# Patient Record
Sex: Female | Born: 1980 | Race: White | Hispanic: No | Marital: Married | Smoking: Never smoker
Health system: Southern US, Community
[De-identification: ages and names within clinical notes are randomized; demographics above are authoritative.]

## PROBLEM LIST (undated history)

## (undated) DIAGNOSIS — Z91018 Allergy to other foods: Secondary | ICD-10-CM

---

## 2017-12-05 ENCOUNTER — Emergency Department (HOSPITAL_BASED_OUTPATIENT_CLINIC_OR_DEPARTMENT_OTHER)
Admission: EM | Admit: 2017-12-05 | Discharge: 2017-12-05 | Disposition: A | Discharge status: Home / Self Care | Payer: Self-pay | Attending: Emergency Medicine | Admitting: Emergency Medicine

## 2017-12-05 ENCOUNTER — Encounter (HOSPITAL_BASED_OUTPATIENT_CLINIC_OR_DEPARTMENT_OTHER): Payer: Self-pay | Admitting: Emergency Medicine

## 2017-12-05 ENCOUNTER — Other Ambulatory Visit: Payer: Self-pay

## 2017-12-05 ENCOUNTER — Emergency Department (HOSPITAL_BASED_OUTPATIENT_CLINIC_OR_DEPARTMENT_OTHER): Payer: Self-pay

## 2017-12-05 DIAGNOSIS — J029 Acute pharyngitis, unspecified: Secondary | ICD-10-CM | POA: Insufficient documentation

## 2017-12-05 HISTORY — DX: Allergy to other foods: Z91.018

## 2017-12-05 LAB — BASIC METABOLIC PANEL
ANION GAP: 8 (ref 5–15)
BUN: 10 mg/dL (ref 6–20)
CALCIUM: 9.5 mg/dL (ref 8.9–10.3)
CO2: 26 mmol/L (ref 22–32)
Chloride: 105 mmol/L (ref 98–111)
Creatinine, Ser: 0.84 mg/dL (ref 0.44–1.00)
GFR calc Af Amer: >60 mL/min (ref >60)
GFR calc non Af Amer: >60 mL/min (ref >60)
GLUCOSE: 116 mg/dL — AB (ref 70–99)
Potassium: 3.8 mmol/L (ref 3.5–5.1)
SODIUM: 139 mmol/L (ref 135–145)

## 2017-12-05 LAB — CBC WITH DIFFERENTIAL/PLATELET
BASOS ABS: 0 10*3/uL (ref 0.0–0.1)
Basophils Relative: 0 %
Eosinophils Absolute: 0.1 10*3/uL (ref 0.0–0.7)
Eosinophils Relative: 1 %
HEMATOCRIT: 40.7 % (ref 36.0–46.0)
Hemoglobin: 13.9 g/dL (ref 12.0–15.0)
Lymphocytes Relative: 27 %
Lymphs Abs: 2.1 10*3/uL (ref 0.7–4.0)
MCH: 29.3 pg (ref 26.0–34.0)
MCHC: 34.2 g/dL (ref 30.0–36.0)
MCV: 85.7 fL (ref 78.0–100.0)
MONO ABS: 0.5 10*3/uL (ref 0.1–1.0)
Monocytes Relative: 6 %
NEUTROS ABS: 5.3 10*3/uL (ref 1.7–7.7)
Neutrophils Relative %: 66 %
PLATELETS: 192 10*3/uL (ref 150–400)
RBC: 4.75 MIL/uL (ref 3.87–5.11)
RDW: 13.2 % (ref 11.5–15.5)
WBC: 8 10*3/uL (ref 4.0–10.5)

## 2017-12-05 LAB — URINALYSIS, ROUTINE W REFLEX MICROSCOPIC
Bilirubin Urine: NEGATIVE
Glucose, UA: NEGATIVE mg/dL
Hgb urine dipstick: NEGATIVE
KETONES UR: NEGATIVE mg/dL
LEUKOCYTES UA: NEGATIVE
NITRITE: NEGATIVE
PH: 7 (ref 5.0–8.0)
PROTEIN: NEGATIVE mg/dL
Specific Gravity, Urine: <1.005 — ABNORMAL LOW (ref 1.005–1.030)

## 2017-12-05 LAB — PREGNANCY, URINE: PREG TEST UR: NEGATIVE

## 2017-12-05 LAB — MONONUCLEOSIS SCREEN: MONO SCREEN: NEGATIVE

## 2017-12-05 LAB — GROUP A STREP BY PCR: Group A Strep by PCR: NOT DETECTED

## 2017-12-05 MED ORDER — IBUPROFEN 600 MG PO TABS: 600.0000 mg | ORAL_TABLET | Freq: Four times a day (QID) | ORAL | 0 refills | Status: AC | PRN

## 2017-12-05 MED ORDER — AMOXICILLIN 500 MG PO CAPS: 500.0000 mg | ORAL_CAPSULE | Freq: Three times a day (TID) | ORAL | 0 refills | Status: AC

## 2017-12-05 MED ORDER — IOPAMIDOL (ISOVUE-300) INJECTION 61%
100.0000 mL | Freq: Once | INTRAVENOUS | Status: AC | PRN
  Administered 2017-12-05: 100 mL via INTRAVENOUS

## 2017-12-05 MED ORDER — HYDROCODONE-ACETAMINOPHEN 5-325 MG PO TABS: 1.0000 | ORAL_TABLET | ORAL | 0 refills | Status: AC | PRN

## 2017-12-05 MED ORDER — AMOXICILLIN 500 MG PO CAPS
500.0000 mg | ORAL_CAPSULE | Freq: Once | ORAL | Status: AC
  Administered 2017-12-05: 500 mg via ORAL
  Filled 2017-12-05: qty 1

## 2017-12-05 MED ORDER — LIDOCAINE VISCOUS HCL 2 % MT SOLN
15.0000 mL | Freq: Once | OROMUCOSAL | Status: AC
  Administered 2017-12-05: 15 mL via OROMUCOSAL
  Filled 2017-12-05: qty 15

## 2017-12-05 NOTE — ED Triage Notes (Addendum)
Patient states that she feels like she has a lump in her throat and swelling to the under side of her neck  - that patient states that she also feels like there are lumps all down into her throat into her chest and it is causing her throat to hurt - the patient states that she is having some trouble swelling and breathing - no noted distress while in triage and swallowing in triage with not difficulties. She also continues with it feels numb on the outside of her throat. The patient continues to have more and more complaints while being triaged - She states that this has been happening " on and off for 2 weeks" - but reports that today it is worse

## 2017-12-05 NOTE — ED Provider Notes (Signed)
[facility] EMERGENCY DEPARTMENT Provider Note   CSN: [id number] Arrival date & time: 12/05/17  1803     History   Chief Complaint Chief Complaint  Patient presents with  . Sore Throat    HPI Shirley Miranda is a 37 y.o. female.  Pt presents to the ED today with a sore throat.  She said she feels like she has lumps on the sides of her throat and in her throat.  She feels like she can't swallow.  She is very anxious and is tearful on exam.  She said sx have been going on for 2 weeks.      Past Medical History:  Diagnosis Date  . Multiple food allergies     There are no active problems to display for this patient.   History reviewed. No pertinent surgical history.   OB History   None      Home Medications    Prior to Admission medications   Medication Sig Start Date End Date Taking? Authorizing Provider  amoxicillin (AMOXIL) 500 MG capsule Take 1 capsule (500 mg total) by mouth 3 (three) times daily. 12/05/17   Jacalyn LefevreHaviland, Nayson Traweek, MD  HYDROcodone-acetaminophen (NORCO/VICODIN) 5-325 MG tablet Take 1 tablet by mouth every 4 (four) hours as needed. 12/05/17   Jacalyn LefevreHaviland, Kimya Mccahill, MD  ibuprofen (ADVIL,MOTRIN) 600 MG tablet Take 1 tablet (600 mg total) by mouth every 6 (six) hours as needed. 12/05/17   Jacalyn LefevreHaviland, Lyliana Dicenso, MD    Family History History reviewed. No pertinent family history.  Social History Social History   Tobacco Use  . Smoking status: Never Smoker  . Smokeless tobacco: Never Used  Substance Use Topics  . Alcohol use: Never    Frequency: Never  . Drug use: Never     Allergies   Prilosec [omeprazole]   Review of Systems Review of Systems  HENT: Positive for sore throat.   All other systems reviewed and are negative.    Physical Exam Updated Vital Signs BP 115/63 (BP Location: Left Arm)   Pulse 72   Temp 98.3 F (36.8 C) (Oral)   Resp 18   Ht 5\' 7"  (1.702 m)   Wt 108.9 kg   LMP 11/14/2017   SpO2 100%   BMI 37.59 kg/m    Physical Exam  Constitutional: She appears well-developed and well-nourished. She appears distressed.  HENT:  Head: Normocephalic and atraumatic.  Right Ear: Hearing, tympanic membrane and ear canal normal.  Left Ear: Hearing, tympanic membrane and ear canal normal.  Mouth/Throat: Uvula is midline and oropharynx is clear and moist.  Eyes: Pupils are equal, round, and reactive to light. EOM are normal.  Cardiovascular: Normal rate and regular rhythm.  Pulmonary/Chest: Effort normal and breath sounds normal.  Abdominal: Soft. Bowel sounds are normal.  Lymphadenopathy:    She has cervical adenopathy.  Neurological: She is alert.  Skin: Skin is warm. Capillary refill takes less than 2 seconds.  Psychiatric: Her behavior is normal. Judgment and thought content normal. Her mood appears anxious.  Nursing note and vitals reviewed.    ED Treatments / Results  Labs (all labs ordered are listed, but only abnormal results are displayed) Labs Reviewed  BASIC METABOLIC PANEL - Abnormal; Notable for the following components:      Result Value   Glucose, Bld 116 (*)    All other components within normal limits  URINALYSIS, ROUTINE W REFLEX MICROSCOPIC - Abnormal; Notable for the following components:   Color, Urine STRAW (*)    Specific  Gravity, Urine <1.005 (*)    All other components within normal limits  GROUP A STREP BY PCR  CBC WITH DIFFERENTIAL/PLATELET  PREGNANCY, URINE  MONONUCLEOSIS SCREEN    EKG None  Radiology Ct Soft Tissue Neck W Contrast  Result Date: 12/05/2017 CLINICAL DATA:  Sore throat/stridor, epiglottitis or tonsillitis suspected. Patient states she feels a lump in the anterior neck. Difficulty swallowing. EXAM: CT NECK WITH CONTRAST TECHNIQUE: Multidetector CT imaging of the neck was performed using the standard protocol following the bolus administration of intravenous contrast. CONTRAST:  ISOVUE-300 IOPAMIDOL (ISOVUE-300) INJECTION 61% COMPARISON:  None.  FINDINGS: Pharynx and larynx: No focal mucosal or submucosal lesions are present. Nasopharynx is within normal limits. Soft palate is unremarkable. There is mild prominence of lingular tonsils without a discrete lesion. Palatine tonsils are mildly enlarged. Parapharyngeal fat is clear. Epiglottis is within normal limits. Hypopharynx is clear. Vocal cords are midline and symmetric. The trachea is within normal limits. Salivary glands: The submandibular and parotid glands are within normal limits bilaterally. Thyroid: Normal. Lymph nodes: Enlarged right jugulodigastric lymph node is nonspecific. No significant cervical adenopathy is present. Vascular: Negative. Limited intracranial: Within normal limits. Visualized orbits: Globes and orbits are normal. Mastoids and visualized paranasal sinuses: The paranasal sinuses and mastoid air cells are clear. Skeleton: There is some reversal of the normal cervical lordosis. AP alignment is anatomic. No focal lytic or blastic lesions are present. Upper chest: The lung apices are clear. IMPRESSION: 1. Mild prominence of palatine and lingual tonsils compatible with acute pharyngitis. 2. Mild prominence of lymph nodes, likely reactive. 3. No airway compromise, mass lesion, or discrete fluid collection/abscess. Electronically Signed   By: Marin Roberts M.D.   On: 12/05/2017 20:30    Procedures Procedures (including critical care time)  Medications Ordered in ED Medications  amoxicillin (AMOXIL) capsule 500 mg (has no administration in time range)  lidocaine (XYLOCAINE) 2 % viscous mouth solution 15 mL (15 mLs Mouth/Throat Given 12/05/17 1928)  iopamidol (ISOVUE-300) 61 % injection 100 mL (100 mLs Intravenous Contrast Given 12/05/17 1957)     Initial Impression / Assessment and Plan / ED Course  I have reviewed the triage vital signs and the nursing notes.  Pertinent labs & imaging results that were available during my care of the patient were reviewed by me and  considered in my medical decision making (see chart for details).    Pt is feeling much better.  She will be started on amox and instructed to return if worse.  Final Clinical Impressions(s) / ED Diagnoses   Final diagnoses:  Acute pharyngitis, unspecified etiology    ED Discharge Orders         Ordered    amoxicillin (AMOXIL) 500 MG capsule  3 times daily     12/05/17 2151    HYDROcodone-acetaminophen (NORCO/VICODIN) 5-325 MG tablet  Every 4 hours PRN     12/05/17 2151    ibuprofen (ADVIL,MOTRIN) 600 MG tablet  Every 6 hours PRN     12/05/17 2151           Jacalyn Lefevre, MD 12/05/17 2152

## 2019-09-06 IMAGING — CT CT NECK W/ CM
4 of 5 series · 15 of 33 positions shown, 18 images · IV contrast (iopamidol)
Comparison: None.

CLINICAL DATA: Sore throat/stridor, epiglottitis or tonsillitis
suspected. Patient states she feels a lump in the anterior neck.
Difficulty swallowing.

EXAM:
CT NECK WITH CONTRAST
TECHNIQUE: Multidetector CT imaging of the neck was performed using the
standard protocol following the bolus administration of intravenous
contrast.
CONTRAST:  100mL HHBRSM-GPP IOPAMIDOL (HHBRSM-GPP) INJECTION 61%

[Series 3: axial neck · axial · 0.57mm/px · z∈[+612,+670]mm · 2 of 118 slices shown]
[im 30/118  bone]
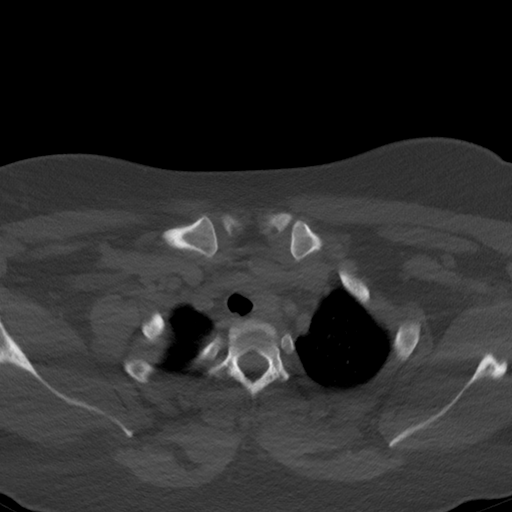
[im 59/118  bone]
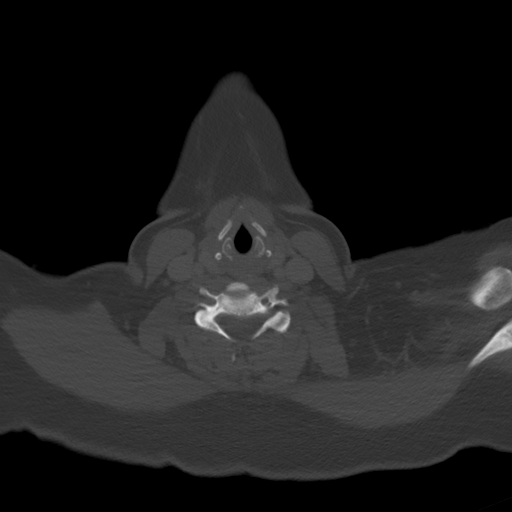

[Series 7: sag neck · sagittal · 0.52mm/px · 5 of 101 slices shown, 6 images]
[im 34/101  bone]
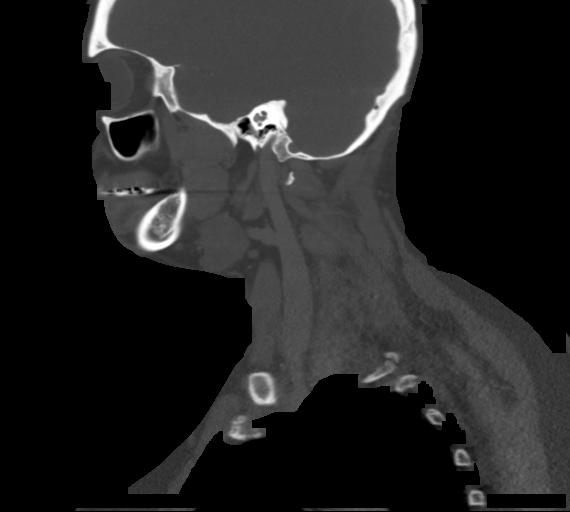
[im 42/101  bone]
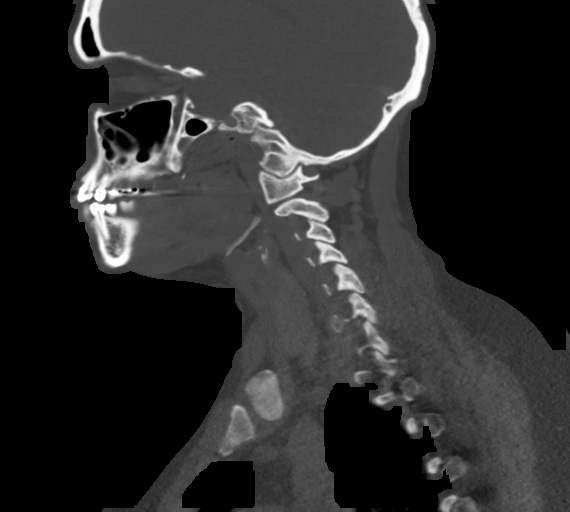
[im 51/101  soft-tissue]
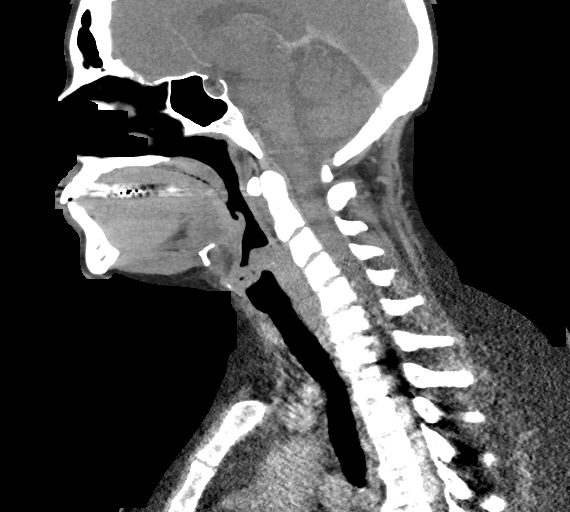
[im 51/101  bone]
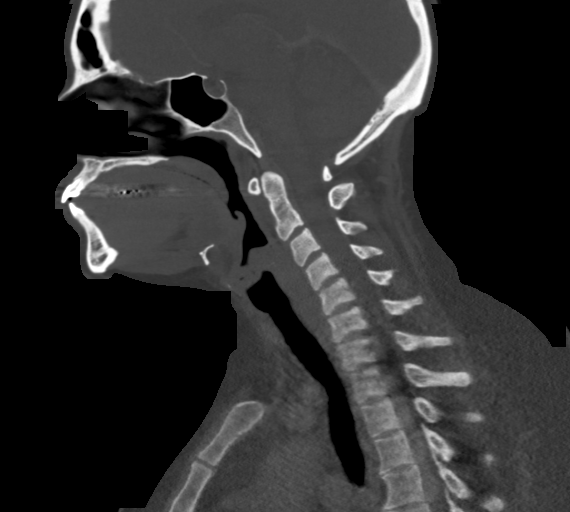
[im 59/101  bone]
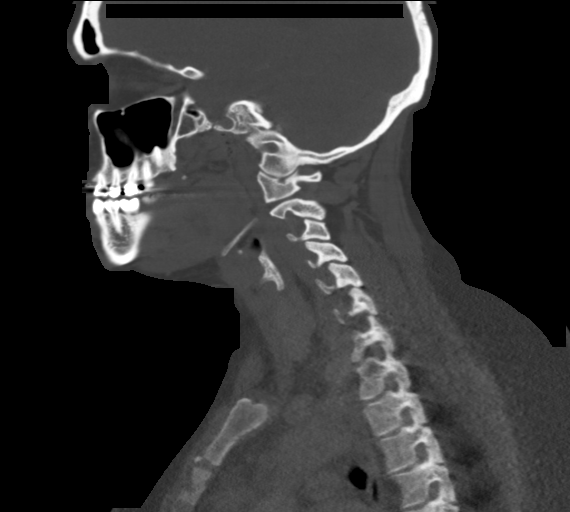
[im 67/101  bone]
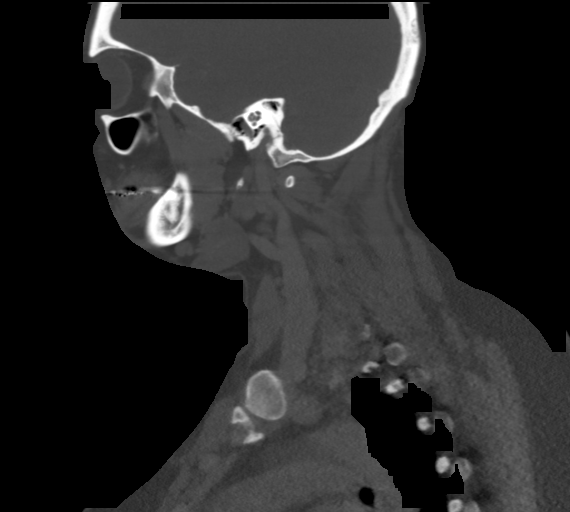

[Series 8: cor neck · coronal · 0.52mm/px · 3 of 101 slices shown]
[im 21/101  bone]
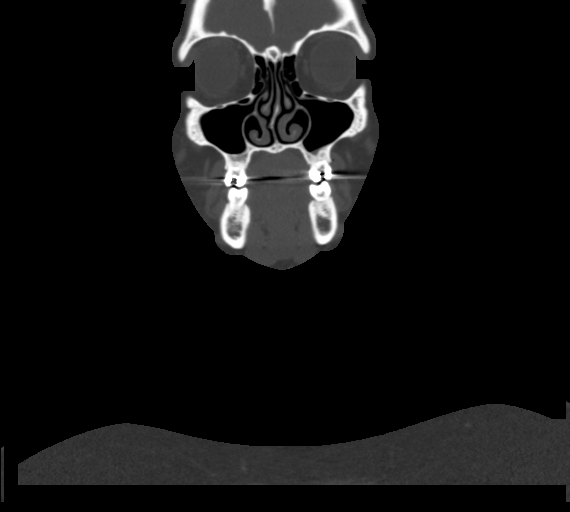
[im 41/101  bone]
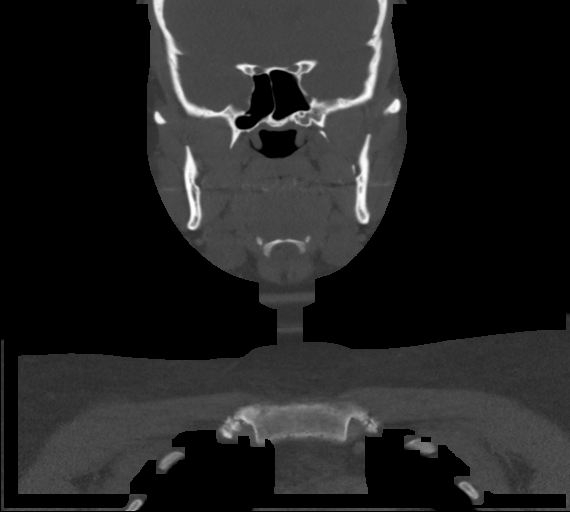
[im 61/101  bone]
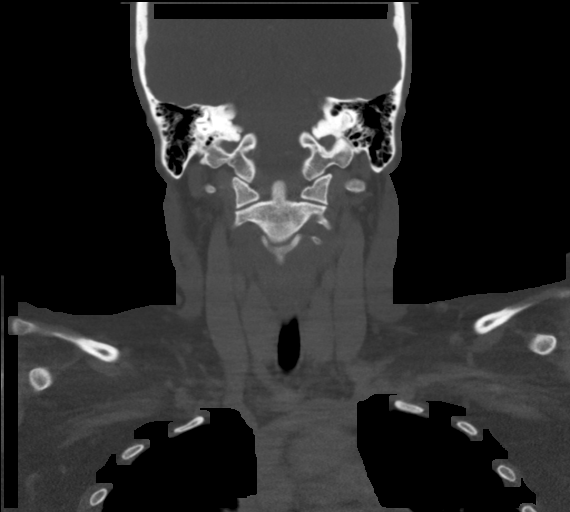

[Series 9: orthogonal ax · axial · 0.39mm/px · z∈[+562,+741]mm · 5 of 142 slices shown, 7 images]
[im 24/142  soft-tissue]
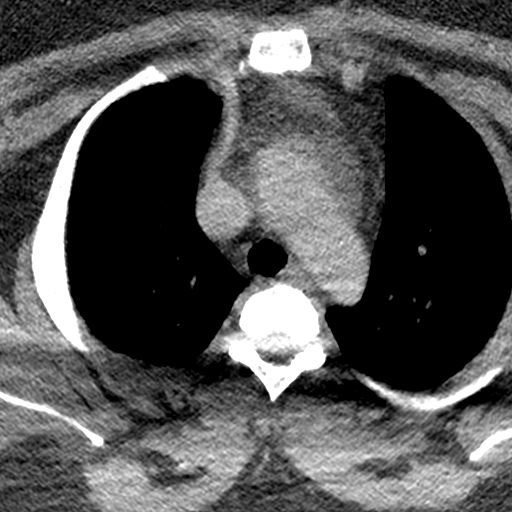
[im 24/142  bone]
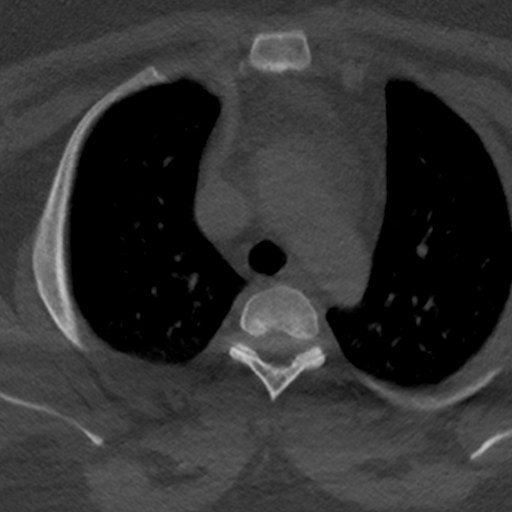
[im 48/142  bone]
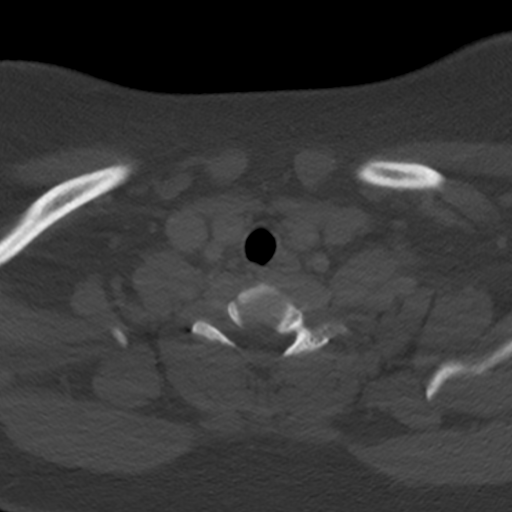
[im 71/142  bone]
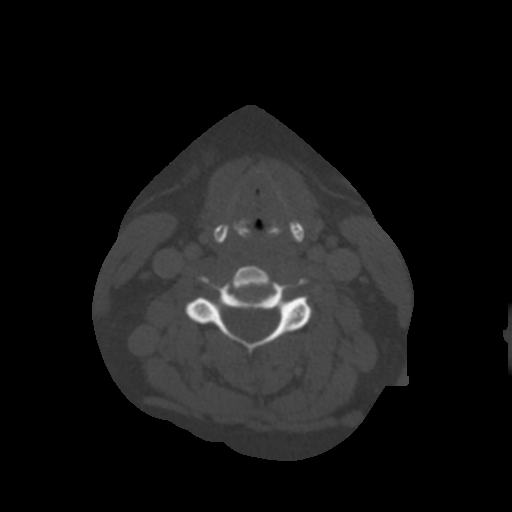
[im 95/142  bone]
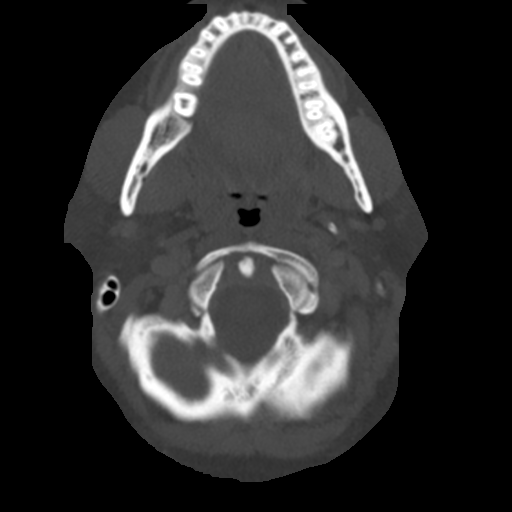
[im 118/142  soft-tissue]
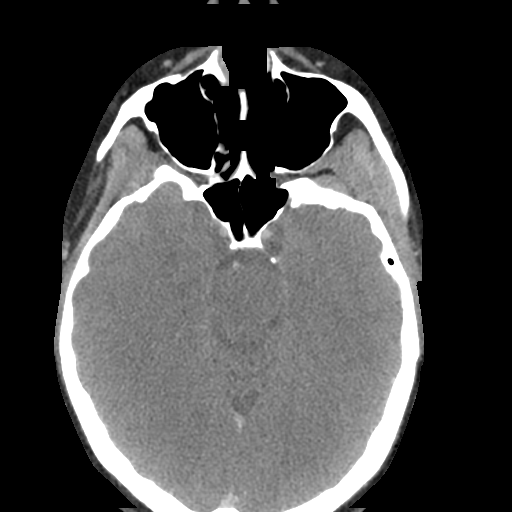
[im 118/142  bone]
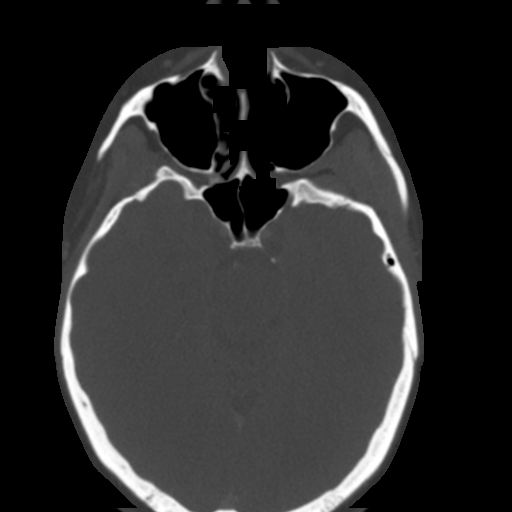

[15 of 33 positions shown; findings below may reference images not displayed]

FINDINGS: Pharynx and larynx: No focal mucosal or submucosal lesions are
present. Nasopharynx is within normal limits. Soft palate is
unremarkable. There is mild prominence of lingular tonsils without a
discrete lesion. Palatine tonsils are mildly enlarged.
Parapharyngeal fat is clear. Epiglottis is within normal limits.
Hypopharynx is clear. Vocal cords are midline and symmetric. The
trachea is within normal limits.

Salivary glands: The submandibular and parotid glands are within
normal limits bilaterally.

Thyroid: Normal.

Lymph nodes: Enlarged right jugulodigastric lymph node is
nonspecific. No significant cervical adenopathy is present.

Vascular: Negative.

Limited intracranial: Within normal limits.

Visualized orbits: Globes and orbits are normal.

Mastoids and visualized paranasal sinuses: The paranasal sinuses and
mastoid air cells are clear.

Skeleton: There is some reversal of the normal cervical lordosis. AP
alignment is anatomic. No focal lytic or blastic lesions are
present.

Upper chest: The lung apices are clear.
IMPRESSION: 1. Mild prominence of palatine and lingual tonsils compatible with
acute pharyngitis.
2. Mild prominence of lymph nodes, likely reactive.
3. No airway compromise, mass lesion, or discrete fluid
collection/abscess.
